# Patient Record
Sex: Female | Born: 1980 | Race: White | Hispanic: No | Smoking: Never smoker
Health system: Southern US, Community
[De-identification: ages and names within clinical notes are randomized; demographics above are authoritative.]

## PROBLEM LIST (undated history)

## (undated) DIAGNOSIS — M722 Plantar fascial fibromatosis: Secondary | ICD-10-CM

## (undated) HISTORY — DX: Plantar fascial fibromatosis: M72.2

---

## 2000-05-29 ENCOUNTER — Other Ambulatory Visit: Admission: RE | Admit: 2000-05-29 | Discharge: 2000-05-29 | Payer: Self-pay | Admitting: Obstetrics and Gynecology

## 2001-12-11 ENCOUNTER — Other Ambulatory Visit: Admission: RE | Admit: 2001-12-11 | Discharge: 2001-12-11 | Payer: Self-pay | Admitting: Obstetrics and Gynecology

## 2003-01-27 ENCOUNTER — Other Ambulatory Visit: Admission: RE | Admit: 2003-01-27 | Discharge: 2003-01-27 | Payer: Self-pay | Admitting: Obstetrics and Gynecology

## 2004-02-18 ENCOUNTER — Other Ambulatory Visit: Admission: RE | Admit: 2004-02-18 | Discharge: 2004-02-18 | Payer: Self-pay | Admitting: Obstetrics and Gynecology

## 2005-03-14 ENCOUNTER — Other Ambulatory Visit: Admission: RE | Admit: 2005-03-14 | Discharge: 2005-03-14 | Payer: Self-pay | Admitting: Obstetrics and Gynecology

## 2009-09-27 ENCOUNTER — Inpatient Hospital Stay (HOSPITAL_COMMUNITY): Admission: AD | Admit: 2009-09-27 | Discharge: 2009-09-27 | Payer: Self-pay | Admitting: Obstetrics and Gynecology

## 2009-09-30 ENCOUNTER — Inpatient Hospital Stay (HOSPITAL_COMMUNITY): Admission: AD | Admit: 2009-09-30 | Discharge: 2009-10-06 | Payer: Self-pay | Admitting: Obstetrics and Gynecology

## 2009-10-03 ENCOUNTER — Encounter (INDEPENDENT_AMBULATORY_CARE_PROVIDER_SITE_OTHER): Payer: Self-pay | Admitting: Obstetrics and Gynecology

## 2010-10-10 LAB — CBC
HCT: 23.5 % — ABNORMAL LOW (ref 36.0–46.0)
HCT: 33.2 % — ABNORMAL LOW (ref 36.0–46.0)
Hemoglobin: 10.5 g/dL — ABNORMAL LOW (ref 12.0–15.0)
Hemoglobin: 11.1 g/dL — ABNORMAL LOW (ref 12.0–15.0)
Hemoglobin: 7.9 g/dL — ABNORMAL LOW (ref 12.0–15.0)
MCHC: 32.9 g/dL (ref 30.0–36.0)
MCHC: 33.4 g/dL (ref 30.0–36.0)
MCHC: 33.4 g/dL (ref 30.0–36.0)
MCHC: 33.8 g/dL (ref 30.0–36.0)
MCV: 84.8 fL (ref 78.0–100.0)
MCV: 84.8 fL (ref 78.0–100.0)
MCV: 85.5 fL (ref 78.0–100.0)
Platelets: 174 10*3/uL (ref 150–400)
Platelets: 186 10*3/uL (ref 150–400)
Platelets: 217 10*3/uL (ref 150–400)
Platelets: 226 10*3/uL (ref 150–400)
RBC: 2.59 MIL/uL — ABNORMAL LOW (ref 3.87–5.11)
RBC: 3.72 MIL/uL — ABNORMAL LOW (ref 3.87–5.11)
RBC: 3.83 MIL/uL — ABNORMAL LOW (ref 3.87–5.11)
RDW: 14.8 % (ref 11.5–15.5)
RDW: 15.1 % (ref 11.5–15.5)
RDW: 15.9 % — ABNORMAL HIGH (ref 11.5–15.5)
WBC: 12.5 10*3/uL — ABNORMAL HIGH (ref 4.0–10.5)
WBC: 9.9 10*3/uL (ref 4.0–10.5)

## 2010-10-10 LAB — COMPREHENSIVE METABOLIC PANEL
ALT: 10 U/L (ref 0–35)
ALT: 14 U/L (ref 0–35)
AST: 17 U/L (ref 0–37)
AST: 18 U/L (ref 0–37)
Albumin: 1.8 g/dL — ABNORMAL LOW (ref 3.5–5.2)
Albumin: 2.3 g/dL — ABNORMAL LOW (ref 3.5–5.2)
CO2: 20 mEq/L (ref 19–32)
Calcium: 7.6 mg/dL — ABNORMAL LOW (ref 8.4–10.5)
Calcium: 8.6 mg/dL (ref 8.4–10.5)
Creatinine, Ser: 0.71 mg/dL (ref 0.4–1.2)
Creatinine, Ser: 0.94 mg/dL (ref 0.4–1.2)
GFR calc Af Amer: >60 mL/min (ref >60)
GFR calc Af Amer: >60 mL/min (ref >60)
Sodium: 134 mEq/L — ABNORMAL LOW (ref 135–145)
Sodium: 135 mEq/L (ref 135–145)
Total Protein: 5.3 g/dL — ABNORMAL LOW (ref 6.0–8.3)

## 2011-08-28 ENCOUNTER — Ambulatory Visit (INDEPENDENT_AMBULATORY_CARE_PROVIDER_SITE_OTHER): Payer: 59 | Admitting: Physician Assistant

## 2011-08-28 VITALS — BP 116/90 | HR 80 | Temp 98.7°F | Resp 16 | Ht 65.0 in | Wt 168.0 lb

## 2011-08-28 DIAGNOSIS — Z23 Encounter for immunization: Secondary | ICD-10-CM

## 2011-08-28 NOTE — Progress Notes (Signed)
  Subjective:    Patient ID: Veronica Parks, female    DOB: 12-12-1980, 31 y.o.   MRN: [id number]  HPI 31 y/o WF presents for Tdap vaccination. 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  Was told she could not return until she got vaccination.  No underlying medical issues.  No other concerns, no need for other vaccinations.   Review of Systems Feeling well.  In good health.     Objective:   Physical Exam Well appearing female.  Not in distress, not acutely ill.        Assessment & Plan:  Tdap vaccination given today.  No other issues.  Side effects of vaccination discussed.  Note for school written.

## 2011-08-28 NOTE — Patient Instructions (Signed)
Tetanus, Diphtheria (Td) or Tetanus, Diphtheria, Pertussis (Tdap) Vaccine What You Need to Know WHY GET VACCINATED? Tetanus, diphtheria and pertussis can be very serious diseases. TETANUS (Lockjaw) causes painful muscle spasms and stiffness, usually all over the body.  Tetanus can lead to tightening of muscles in the head and neck so the victim cannot open his mouth or swallow, or sometimes even breathe. Tetanus kills about 1 out of 5 people who are infected.  DIPHTHERIA can cause a thick membrane to cover the back of the throat.  Diphtheria can lead to breathing problems, paralysis, heart failure, and even death.  PERTUSSIS (Whooping Cough) causes severe coughing spells which can lead to difficulty breathing, vomiting, and disturbed sleep.  Pertussis can lead to weight loss, incontinence, rib fractures and passing out from violent coughing. Up to 2 in 100 adolescents and 5 in 100 adults with pertussis are hospitalized or have complications, including pneumonia and death.  These 3 diseases are all caused by bacteria. Diphtheria and pertussis are spread from person to person. Tetanus enters the body through cuts, scratches, or wounds. The [place] saw as many as 200,000 cases a year of diphtheria and pertussis before vaccines were available, and hundreds of cases of tetanus. Since then, tetanus and diphtheria cases have dropped by about 99% and pertussis cases by about 92%. Children 44 years of age and younger get DTaP vaccine to protect them from these three diseases. But older children, adolescents, and adults need protection too. VACCINES FOR ADOLESCENTS AND ADULTS: TD AND TDAP Two vaccines are available to protect people 51 years of age and older from these diseases:  Td vaccine has been used for many years. It protects against tetanus and diphtheria.   Tdap vaccine was licensed in 2005. It is the first vaccine for adolescents and adults that protects against pertussis as well as tetanus  and diphtheria.  A Td booster dose is recommended every 10 years. Tdap is given only once. WHICH VACCINE, AND WHEN? Ages 7 through 18 years  A dose of Tdap is recommended at age 32 or 73. This dose could be given as early as age 58 for children who missed one or more childhood doses of DTaP.   Children and adolescents who did not get a complete series of DTaP shots by age 22 should complete the series using a combination of Td and Tdap.  Age 27 years and Older  All adults should get a booster dose of Td every 10 years. Adults under 65 who have never gotten Tdap should get a dose of Tdap as their next booster dose. Adults 65 and older may get one booster dose of Tdap.   Adults (including women who may become pregnant and adults 36 and older) who expect to have close contact with a baby younger than 94 months of age should get a dose of Tdap to help protect the baby from pertussis.   Healthcare professionals who have direct patient contact in hospitals or clinics should get one dose of Tdap.  Protection After a Wound  A person who gets a severe cut or burn might need a dose of Td or Tdap to prevent tetanus infection. Tdap should be used for anyone who has never had a dose previously. Td should be used if Tdap is not available, or for:   Anybody who has already had a dose of Tdap.   Children 7 through 87 years of age who completed the childhood DTaP series.   Adults 65 and older.  Pregnant Women  Pregnant women who have never had a dose of Tdap should get one, after the 20th week of gestation and preferably during the 3rd trimester. If they do not get Tdap during their pregnancy they should get a dose as soon as possible after delivery. Pregnant women who have previously received Tdap and need tetanus or diphtheria vaccine while pregnant should get Td.  Tdap and Td may be given at the same time as other vaccines. SOME PEOPLE SHOULD NOT BE VACCINATED OR SHOULD WAIT  Anyone who has had a  life-threatening allergic reaction after a dose of any tetanus, diphtheria, or pertussis containing vaccine should not get Td or Tdap.   Anyone who has a severe allergy to any component of a vaccine should not get that vaccine. Tell your doctor if the person getting the vaccine has any severe allergies.   Anyone who had a coma, or long or multiple seizures within 7 days after a dose of DTP or DTaP should not get Tdap, unless a cause other than the vaccine was found. These people may get Td.   Talk to your doctor if the person getting either vaccine:   Has epilepsy or another nervous system problem.   Had severe swelling or severe pain after a previous dose of DTP, DTaP, DT, Td, or Tdap vaccine.   Has had Guillain Barr Syndrome (GBS).  Anyone who has a moderate or severe illness on the day the shot is scheduled should usually wait until they recover before getting Tdap or Td vaccine. A person with a mild illness or low fever can usually be vaccinated. WHAT ARE THE RISKS FROM TDAP AND TD VACCINES? With a vaccine, as with any medicine, there is always a small risk of a life-threatening allergic reaction or other serious problem. Brief fainting spells and related symptoms (such as jerking movements) can happen after any medical procedure, including vaccination. Sitting or lying down for about 15 minutes after a vaccination can help prevent fainting and injuries caused by falls. Tell your doctor if the patient feels dizzy or lightheaded, or has vision changes or ringing in the ears. Getting tetanus, diphtheria, or pertussis would be much more likely to lead to severe problems than getting either Td or Tdap vaccine. Problems reported after Td and Tdap vaccines are listed below. Mild Problems (noticeable, but did not interfere with activities) Tdap  Pain (about 3 in 4 adolescents and 2 in 3 adults).   Redness or swelling (about 1 in 5).   Mild fever of at least 100.4 F (38 C) (up to about 1 in  25 adolescents and 1 in 100 adults).   Headache (about 4 in 10 adolescents and 3 in 10 adults).   Tiredness (about 1 in 3 adolescents and 1 in 4 adults).   Nausea, vomiting, diarrhea, or stomach ache (up to 1 in 4 adolescents and 1 in 10 adults).   Chills, body aches, sore joints, rash, or swollen glands (uncommon).  Td  Pain (up to about 8 in 10).   Redness or swelling at the injection site (up to about 1 in 3).   Mild fever (up to about 1 in 15).   Headache or tiredness (uncommon).  Moderate Problems (interfered with activities, but did not require medical attention) Tdap  Pain at the injection site (about 1 in 20 adolescents and 1 in 100 adults).   Redness or swelling at the injection site (up to about 1 in 16 adolescents and 1 in 25  adults).   Fever over 102 F (38.9 C) (about 1 in 100 adolescents and 1 in 250 adults).   Headache (1 in 300).   Nausea, vomiting, diarrhea, or stomach ache (up to 3 in 100 adolescents and 1 in 100 adults).  Td  Fever over 102 F (38.9 C) (rare).  Tdap or Td  Extensive swelling of the arm where the shot was given (up to about 3 in 100).  Severe Problems (Unable to perform usual activities; required medical attention) Tdap or Td  Swelling, severe pain, bleeding, and redness in the arm where the shot was given (rare).  A severe allergic reaction could occur after any vaccine. They are estimated to occur less than once in a million doses. WHAT IF THERE IS A SEVERE REACTION? What should I look for? Any unusual condition, such as a severe allergic reaction or a high fever. If a severe allergic reaction occurred, it would be within a few minutes to an hour after the shot. Signs of a serious allergic reaction can include difficulty breathing, weakness, hoarseness or wheezing, a fast heartbeat, hives, dizziness, paleness, or swelling of the throat. What should I do?  Call a doctor, or get the person to a doctor right away.   Tell your doctor  what happened, the date and time it happened, and when the vaccination was given.   Ask your provider to report the reaction by filing a Vaccine Adverse Event Reporting System (VAERS) form. Or, you can file this report through the VAERS website at www.vaers.LAgents.no or by calling 1-737-459-5818.  VAERS does not provide medical advice. THE NATIONAL VACCINE INJURY COMPENSATION PROGRAM The National Vaccine Injury Compensation Program (VICP) was created in 1986. Persons who believe they may have been injured by a vaccine can learn about the program and about filing a claim by calling 1-6052107427 or visiting the VICP website at SpiritualWord.at. HOW CAN I LEARN MORE?  Your doctor can give you the vaccine package insert or suggest other sources of information.   Call your local or state health department.   Contact the Centers for Disease Control and Prevention (CDC):   Call 443 876 7967 (1-800-CDC-INFO).   Visit the [facility] website at PicCapture.uy.  CDC Td and Tdap Interim VIS (1/12) Document Released: 04/30/2006 Document Revised: 03/21/2011 Document Reviewed: 08/09/2010 [facility] Patient Information 2012 [place], [place].

## 2013-07-29 ENCOUNTER — Telehealth: Payer: Self-pay | Admitting: Family Medicine

## 2013-07-29 MED ORDER — OSELTAMIVIR PHOSPHATE 75 MG PO CAPS: 75.0000 mg | ORAL_CAPSULE | Freq: Two times a day (BID) | ORAL | Status: DC

## 2013-07-29 NOTE — Telephone Encounter (Signed)
I agree. Approved.  Patient's husband seen by me in office today with positive influenza test.  Patient reports that she was in normal state of health through yesterday but today has developed cough and fever. Therefore will call in Tamiflu to treat for influenza. She has been informed to use Tylenol and Motrin as needed for fever and myalgias. As well she is aware to followup if symptoms worsen or do not improve and resolve over the upcoming week.

## 2013-07-29 NOTE — Telephone Encounter (Signed)
Husband and son with flu. Wants RX called in for her, starting with cough.  Per provider Tamiflu 75 mg bid x 5 days to pharmacy

## 2013-07-29 NOTE — Telephone Encounter (Signed)
Tamiflu sent to correct pharmacy

## 2014-07-06 ENCOUNTER — Ambulatory Visit (INDEPENDENT_AMBULATORY_CARE_PROVIDER_SITE_OTHER): Payer: BC Managed Care – PPO

## 2014-07-06 ENCOUNTER — Ambulatory Visit: Payer: BC Managed Care – PPO | Admitting: Emergency Medicine

## 2014-07-06 ENCOUNTER — Ambulatory Visit (INDEPENDENT_AMBULATORY_CARE_PROVIDER_SITE_OTHER): Payer: BC Managed Care – PPO | Admitting: Emergency Medicine

## 2014-07-06 VITALS — BP 130/78 | HR 91 | Temp 98.3°F | Resp 20 | Ht 65.0 in | Wt 210.0 lb

## 2014-07-06 DIAGNOSIS — S8001XA Contusion of right knee, initial encounter: Secondary | ICD-10-CM

## 2014-07-06 DIAGNOSIS — J0140 Acute pansinusitis, unspecified: Secondary | ICD-10-CM

## 2014-07-06 DIAGNOSIS — J209 Acute bronchitis, unspecified: Secondary | ICD-10-CM

## 2014-07-06 DIAGNOSIS — Z3202 Encounter for pregnancy test, result negative: Secondary | ICD-10-CM

## 2014-07-06 LAB — POCT URINE PREGNANCY: PREG TEST UR: NEGATIVE

## 2014-07-06 MED ORDER — PSEUDOEPHEDRINE-GUAIFENESIN ER 60-600 MG PO TB12
1.0000 | ORAL_TABLET | Freq: Two times a day (BID) | ORAL | Status: AC
Start: 2014-07-06 — End: 2015-07-06

## 2014-07-06 MED ORDER — HYDROCOD POLST-CHLORPHEN POLST 10-8 MG/5ML PO LQCR: 5.0000 mL | Freq: Two times a day (BID) | ORAL | Status: DC | PRN

## 2014-07-06 MED ORDER — AMOXICILLIN-POT CLAVULANATE 875-125 MG PO TABS: 1.0000 | ORAL_TABLET | Freq: Two times a day (BID) | ORAL | Status: DC

## 2014-07-06 MED ORDER — NAPROXEN SODIUM 550 MG PO TABS: 550.0000 mg | ORAL_TABLET | Freq: Two times a day (BID) | ORAL | Status: AC

## 2014-07-06 NOTE — Progress Notes (Signed)
[facility] [address], [place] [address] [phone]  Date:  07/06/2014   Name:  Veronica ArchMiranda Parks   DOB:  [birth year]   MRN:  [id number]  PCP:  PROVIDER NOT IN SYSTEM    Chief Complaint: Cough and Nasal Congestion   History of Present Illness:  Veronica ArchMiranda Parks is a 33 y.o. very pleasant female patient who presents with the following:  Ill for a week with nasal congestion and purulent nasal drainage and post nasal drip since last week Has no sore throat No fever or chills Has a cough productive of purulent sputum No wheezing or shortness of breath No nausea or vomiting.  No stool change No rash No improvement with over the counter medications or other home remedies.  Denies other complaint or health concern today.  There are no active problems to display for this patient.   History reviewed. No pertinent past medical history.  Past Surgical History  Procedure Laterality Date  . Cesarean section      History  Substance Use Topics  . Smoking status: Never Smoker   . Smokeless tobacco: Never Used  . Alcohol Use: 0.0 oz/week    0 Not specified per week    Family History  Problem Relation Age of Onset  . Hypertension Mother   . Cancer Maternal Grandfather     No Known Allergies  Medication list has been reviewed and updated.  No current outpatient prescriptions on file prior to visit.   No current facility-administered medications on file prior to visit.    Review of Systems:  As per HPI, otherwise negative.    Physical Examination: Filed Vitals:   07/06/14 1923  BP: 130/78  Pulse: 91  Temp: 98.3 F (36.8 C)  Resp: 20   Filed Vitals:   07/06/14 1923  Height: 5\' 5"  (1.651 m)  Weight: 210 lb (95.255 kg)   Body mass index is 34.95 kg/(m^2). Ideal Body Weight: Weight in (lb) to have BMI = 25: 149.9  GEN: WDWN, NAD, Non-toxic, A & O x 3 HEENT: Atraumatic, Normocephalic. Neck supple. No masses, No LAD. Ears and Nose: No  external deformity. CV: RRR, No M/G/R. No JVD. No thrill. No extra heart sounds. PULM: CTA B, no wheezes, crackles, rhonchi. No retractions. No resp. distress. No accessory muscle use. ABD: S, NT, ND, +BS. No rebound. No HSM. EXTR: No c/c/e NEURO Normal gait.  PSYCH: Normally interactive. Conversant. Not depressed or anxious appearing.  Calm demeanor.    Assessment and Plan: Sinusitis and bronchitis augmentin mucinex d tussionex   Signed,  Phillips OdorJeffery Anderson, MD

## 2014-07-06 NOTE — Patient Instructions (Signed)
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
## 2014-07-06 NOTE — Patient Instructions (Signed)

## 2014-07-13 ENCOUNTER — Ambulatory Visit (INDEPENDENT_AMBULATORY_CARE_PROVIDER_SITE_OTHER): Payer: BC Managed Care – PPO | Admitting: Family Medicine

## 2014-07-13 ENCOUNTER — Encounter: Payer: Self-pay | Admitting: Family Medicine

## 2014-07-13 VITALS — BP 118/94 | HR 108 | Temp 97.7°F | Resp 16 | Ht 65.5 in | Wt 205.6 lb

## 2014-07-13 DIAGNOSIS — J209 Acute bronchitis, unspecified: Secondary | ICD-10-CM

## 2014-07-13 DIAGNOSIS — J0100 Acute maxillary sinusitis, unspecified: Secondary | ICD-10-CM

## 2014-07-13 DIAGNOSIS — J9801 Acute bronchospasm: Secondary | ICD-10-CM

## 2014-07-13 MED ORDER — PREDNISONE 20 MG PO TABS: ORAL_TABLET | ORAL | Status: DC

## 2014-07-13 MED ORDER — ALBUTEROL SULFATE HFA 108 (90 BASE) MCG/ACT IN AERS: 2.0000 | INHALATION_SPRAY | RESPIRATORY_TRACT | Status: DC | PRN

## 2014-07-13 MED ORDER — BENZONATATE 100 MG PO CAPS: 100.0000 mg | ORAL_CAPSULE | Freq: Three times a day (TID) | ORAL | Status: DC | PRN

## 2014-07-13 MED ORDER — HYDROCOD POLST-CHLORPHEN POLST 10-8 MG/5ML PO LQCR: 5.0000 mL | Freq: Two times a day (BID) | ORAL | Status: DC | PRN

## 2014-07-13 NOTE — Progress Notes (Signed)
Subjective:    Patient ID: Veronica Parks, female    DOB: 19-Jan-1981, 33 y.o.   MRN: [id number]  HPI Patient returns to clinic for worsening productive cough. Evaluated one week ago by Dr. Dareen PianoAnderson; treated with Augmentin, Tussionex, Mucinex D.  Pt now feels worse than last week; energy level has worsened because not sleeping due to cough at nighttime.   Cough is keeping her up at night and causing back pain and headaches. Sputum now is green and does not feel like is getting it all out. Feels more tired and was unable to finish work today. Has nasal and chest congestion with rhinorrhea and chest pain secondary to cough. Has also had single episode of nausea and diarrhea today.  Denies fever/chills/sweats.  +HA.  No ear pain or sore throat. No rhinorrhea; +nasal congestion.  Scant PND.  +horrible cough.  +wheezing. No SOB.    No h/o asthma or allergies and is not a smoker. 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   Works as an Airline pilotaccountant.  Review of Systems  Constitutional: Positive for fatigue. Negative for fever (but felt hot), chills and diaphoresis.  HENT: Positive for congestion, rhinorrhea and voice change. Negative for ear pain, postnasal drip, sinus pressure, sneezing, sore throat and trouble swallowing.   Respiratory: Positive for cough. Negative for shortness of breath and wheezing.   Cardiovascular: Positive for chest pain (secondary to cough). Negative for palpitations and leg swelling.  Gastrointestinal: Positive for nausea (isolated episode) and diarrhea (this afternoon). Negative for vomiting and abdominal pain.  Musculoskeletal: Positive for back pain.  Skin: Negative for rash.  Allergic/Immunologic: Negative for environmental allergies and food allergies.  Neurological: Positive for headaches. Negative for dizziness and light-headedness.       Objective:   Physical Exam  Constitutional: She is oriented to person, place,  and time. She appears well-developed and well-nourished. No distress.  Blood pressure 118/94, pulse 108, temperature 97.7 F (36.5 C), temperature source Oral, resp. rate 16, height 5' 5.5" (1.664 m), weight 205 lb 9.6 oz (93.26 kg), last menstrual period 06/19/2014, SpO2 98 %.  HENT:  Head: Normocephalic and atraumatic.  Right Ear: External ear normal.  Left Ear: External ear normal.  Nose: Nose normal.  Mouth/Throat: Oropharynx is clear and moist. No oropharyngeal exudate.  Eyes: Conjunctivae and EOM are normal. Pupils are equal, round, and reactive to light. Right eye exhibits no discharge. Left eye exhibits no discharge. No scleral icterus.  Neck: Neck supple. No thyromegaly present.  Cardiovascular: Regular rhythm and normal heart sounds.  Tachycardia present.  Exam reveals no gallop and no friction rub.   No murmur heard. Pulmonary/Chest: Effort normal. No tachypnea and no bradypnea. No respiratory distress. She has no decreased breath sounds. She has wheezes (fine wheezes worsened by forced expiration) in the right middle field, the right lower field, the left middle field and the left lower field. She has no rhonchi. She has no rales. She exhibits no tenderness.  Abdominal: Soft. Bowel sounds are normal. There is no tenderness.  Musculoskeletal:  Mid back pain reproducible with palpation.  Lymphadenopathy:    She has no cervical adenopathy.  Neurological: She is alert and oriented to person, place, and time.  Skin: Skin is warm and dry. No rash noted. She is not diaphoretic. No erythema. No pallor.        Assessment & Plan:  1. Acute maxillary sinusitis, recurrence not specified -Persistent; complete Augmentin  as prescribed.   -Add Prednisone. -Continue Mucinex D.  2. Bronchospasm -New.  Etiology to worsening cough.   -Rx for Albuterol HFA provided to use qid scheduled for next week and then PRN. -Rx for Prednisone taper provided. -Refill of Tussionex provided.  3. Acute  bronchitis, unspecified organism -New.  - albuterol (PROVENTIL HFA;VENTOLIN HFA) 108 (90 BASE) MCG/ACT inhaler; Inhale 2 puffs into the lungs every 4 (four) hours as needed for wheezing or shortness of breath (cough, shortness of breath or wheezing.).  Dispense: 1 Inhaler; Refill: 1 - benzonatate (TESSALON) 100 MG capsule; Take 1-2 capsules (100-200 mg total) by mouth 3 (three) times daily as needed for cough.  Dispense: 40 capsule; Refill: 0   Meds ordered this encounter  Medications  . albuterol (PROVENTIL HFA;VENTOLIN HFA) 108 (90 BASE) MCG/ACT inhaler    Sig: Inhale 2 puffs into the lungs every 4 (four) hours as needed for wheezing or shortness of breath (cough, shortness of breath or wheezing.).    Dispense:  1 Inhaler    Refill:  1    Order Specific Question:  Supervising Provider    Answer:  Ethelda ChickSMITH, Alarik Radu M [[id number]]  . benzonatate (TESSALON) 100 MG capsule    Sig: Take 1-2 capsules (100-200 mg total) by mouth 3 (three) times daily as needed for cough.    Dispense:  40 capsule    Refill:  0    Order Specific Question:  Supervising Provider    Answer:  Ethelda ChickSMITH, Celina Shiley M [[id number]]  . chlorpheniramine-HYDROcodone (TUSSIONEX PENNKINETIC ER) 10-8 MG/5ML LQCR    Sig: Take 5 mLs by mouth every 12 (twelve) hours as needed.    Dispense:  120 mL    Refill:  0  . predniSONE (DELTASONE) 20 MG tablet    Sig: Three tablets daily x 2 days then two tablets daily x 5 days then one tablet daily x 3 days    Dispense:  19 tablet    Refill:  0    Cecilie LowersKristi Martin Little [place], M.D. [facility] 07/14/2014 9:51 AM

## 2014-08-03 ENCOUNTER — Ambulatory Visit (INDEPENDENT_AMBULATORY_CARE_PROVIDER_SITE_OTHER): Payer: BLUE CROSS/BLUE SHIELD | Admitting: Family Medicine

## 2014-08-03 ENCOUNTER — Encounter: Payer: Self-pay | Admitting: Family Medicine

## 2014-08-03 VITALS — BP 110/74 | HR 100 | Temp 98.3°F | Resp 20 | Wt 205.0 lb

## 2014-08-03 DIAGNOSIS — J111 Influenza due to unidentified influenza virus with other respiratory manifestations: Secondary | ICD-10-CM

## 2014-08-03 DIAGNOSIS — J029 Acute pharyngitis, unspecified: Secondary | ICD-10-CM

## 2014-08-03 DIAGNOSIS — R509 Fever, unspecified: Secondary | ICD-10-CM

## 2014-08-03 LAB — INFLUENZA A AND B
Inflenza A Ag: NEGATIVE
Influenza B Ag: NEGATIVE

## 2014-08-03 LAB — RAPID STREP SCREEN (MED CTR MEBANE ONLY): Streptococcus, Group A Screen (Direct): NEGATIVE

## 2014-08-03 NOTE — Progress Notes (Signed)
Subjective:    Patient ID: Veronica Parks, female    DOB: 08/30/1980, 34 y.o.   MRN: [id number]  HPI Patient is a very pleasant 34 year old white female who developed a fever to 101 yesterday along with diffuse myalgias, sore throat, and head congestion. Husband has recently been diagnosed with a flulike syndrome. She denies any ear pain. She denies any sinus pain. She denies any productive cough. No past medical history on file. Past Surgical History  Procedure Laterality Date  . Cesarean section     Current Outpatient Prescriptions on File Prior to Visit  Medication Sig Dispense Refill  . albuterol (PROVENTIL HFA;VENTOLIN HFA) 108 (90 BASE) MCG/ACT inhaler Inhale 2 puffs into the lungs every 4 (four) hours as needed for wheezing or shortness of breath (cough, shortness of breath or wheezing.). 1 Inhaler 1  . amoxicillin-clavulanate (AUGMENTIN) 875-125 MG per tablet Take 1 tablet by mouth 2 (two) times daily. (Patient not taking: Reported on 08/03/2014) 20 tablet 0  . benzonatate (TESSALON) 100 MG capsule Take 1-2 capsules (100-200 mg total) by mouth 3 (three) times daily as needed for cough. (Patient not taking: Reported on 08/03/2014) 40 capsule 0  . chlorpheniramine-HYDROcodone (TUSSIONEX PENNKINETIC ER) 10-8 MG/5ML LQCR Take 5 mLs by mouth every 12 (twelve) hours as needed. (Patient not taking: Reported on 08/03/2014) 120 mL 0  . naproxen sodium (ANAPROX DS) 550 MG tablet Take 1 tablet (550 mg total) by mouth 2 (two) times daily with a meal. (Patient not taking: Reported on 08/03/2014) 40 tablet 0  . predniSONE (DELTASONE) 20 MG tablet Three tablets daily x 2 days then two tablets daily x 5 days then one tablet daily x 3 days (Patient not taking: Reported on 08/03/2014) 19 tablet 0  . pseudoephedrine-guaifenesin (MUCINEX D) 60-600 MG per tablet Take 1 tablet by mouth every 12 (twelve) hours. (Patient not taking: Reported on 08/03/2014) 18 tablet 0   No current facility-administered medications  on file prior to visit.   No Known Allergies History   Social History  . Marital Status: Married    Spouse Name: N/A    Number of Children: N/A  . Years of Education: N/A   Occupational History  . Not on file.   Social History Main Topics  . Smoking status: Never Smoker   . Smokeless tobacco: Never Used  . Alcohol Use: 0.0 oz/week    0 Not specified per week  . Drug Use: No  . Sexual Activity: Not on file   Other Topics Concern  . Not on file   Social History Narrative      Review of Systems  All other systems reviewed and are negative.      Objective:   Physical Exam  Constitutional: She appears well-developed and well-nourished. No distress.  HENT:  Right Ear: External ear normal.  Left Ear: External ear normal.  Nose: Mucosal edema and rhinorrhea present. Right sinus exhibits no maxillary sinus tenderness and no frontal sinus tenderness. Left sinus exhibits no maxillary sinus tenderness and no frontal sinus tenderness.  Mouth/Throat: Oropharynx is clear and moist. No oropharyngeal exudate.  Eyes: Conjunctivae are normal. Pupils are equal, round, and reactive to light.  Neck: Neck supple.  Cardiovascular: Normal rate, regular rhythm and normal heart sounds.   No murmur heard. Pulmonary/Chest: Effort normal and breath sounds normal. No respiratory distress. She has no wheezes. She has no rales.  Lymphadenopathy:    She has no cervical adenopathy.  Skin: She is not diaphoretic.  Vitals reviewed.  Assessment & Plan:  Fever chills - Plan: Rapid strep screen, Influenza a and b  Sore throat - Plan: Rapid strep screen, Influenza a and b  Flu syndrome  I believe the patient has a viral upper respiratory infection. I recommended supportive care including Sudafed for congestion, Tylenol and Advil for aches and fever, Chloraseptic for sore throat. I recommended tincture of time. I anticipate 7-10 days before complete resolution of symptoms.

## 2016-03-15 ENCOUNTER — Ambulatory Visit (INDEPENDENT_AMBULATORY_CARE_PROVIDER_SITE_OTHER): Payer: BLUE CROSS/BLUE SHIELD | Admitting: Physician Assistant

## 2016-03-15 ENCOUNTER — Encounter: Payer: Self-pay | Admitting: Physician Assistant

## 2016-03-15 VITALS — BP 140/90 | HR 87 | Temp 98.7°F | Resp 18 | Wt 250.0 lb

## 2016-03-15 DIAGNOSIS — R609 Edema, unspecified: Secondary | ICD-10-CM

## 2016-03-15 DIAGNOSIS — I10 Essential (primary) hypertension: Secondary | ICD-10-CM | POA: Diagnosis not present

## 2016-03-15 DIAGNOSIS — K219 Gastro-esophageal reflux disease without esophagitis: Secondary | ICD-10-CM | POA: Diagnosis not present

## 2016-03-15 DIAGNOSIS — R5383 Other fatigue: Secondary | ICD-10-CM

## 2016-03-15 LAB — CBC WITH DIFFERENTIAL/PLATELET
BASOS PCT: 0 %
Basophils Absolute: 0 cells/uL (ref 0–200)
EOS PCT: 1 %
Eosinophils Absolute: 79 cells/uL (ref 15–500)
HCT: 42.2 % (ref 35.0–45.0)
Hemoglobin: 14 g/dL (ref 12.0–15.0)
LYMPHS PCT: 33 %
Lymphs Abs: 2607 cells/uL (ref 850–3900)
MCH: 30.3 pg (ref 27.0–33.0)
MCHC: 33.2 g/dL (ref 32.0–36.0)
MCV: 91.3 fL (ref 80.0–100.0)
MONOS PCT: 9 %
MPV: 9.3 fL (ref 7.5–12.5)
Monocytes Absolute: 711 cells/uL (ref 200–950)
NEUTROS ABS: 4503 {cells}/uL (ref 1500–7800)
Neutrophils Relative %: 57 %
PLATELETS: 317 10*3/uL (ref 140–400)
RBC: 4.62 MIL/uL (ref 3.80–5.10)
RDW: 13.9 % (ref 11.0–15.0)
WBC: 7.9 10*3/uL (ref 3.8–10.8)

## 2016-03-15 LAB — COMPLETE METABOLIC PANEL WITH GFR
ALT: 14 U/L (ref 6–29)
AST: 16 U/L (ref 10–30)
Albumin: 3.9 g/dL (ref 3.6–5.1)
Alkaline Phosphatase: 58 U/L (ref 33–115)
BUN: 9 mg/dL (ref 7–25)
CHLORIDE: 103 mmol/L (ref 98–110)
CO2: 24 mmol/L (ref 20–31)
Calcium: 8.9 mg/dL (ref 8.6–10.2)
Creat: 0.96 mg/dL (ref 0.50–1.10)
GFR, EST NON AFRICAN AMERICAN: 77 mL/min (ref >=60)
GFR, Est African American: 89 mL/min (ref >=60)
GLUCOSE: 81 mg/dL (ref 70–99)
POTASSIUM: 4 mmol/L (ref 3.5–5.3)
SODIUM: 137 mmol/L (ref 135–146)
Total Bilirubin: 0.6 mg/dL (ref 0.2–1.2)
Total Protein: 6.6 g/dL (ref 6.1–8.1)

## 2016-03-15 LAB — TSH: TSH: 2.59 mIU/L

## 2016-03-15 MED ORDER — LISINOPRIL 10 MG PO TABS: 10.0000 mg | ORAL_TABLET | Freq: Every day | ORAL | 0 refills | Status: DC

## 2016-03-15 MED ORDER — OMEPRAZOLE 20 MG PO CPDR: 20.0000 mg | DELAYED_RELEASE_CAPSULE | Freq: Every day | ORAL | 3 refills | Status: DC

## 2016-03-15 NOTE — Progress Notes (Signed)
Patient ID: Veronica Parks MRN: [id number], DOB: [birth year], 35 y.o. Date of Encounter: @DATE @  Chief Complaint:  Chief Complaint  Patient presents with  . Chills    cold chills,`  . Edema     both feet and ankle swelling  . Fatigue    HPI: 35 y.o. year old female  presents with above.   Says that a couple weeks ago her son was sick with a fever. A few days after that-- she was feeling some chills-- so she started to check her temperature in case she was getting his illness. However, all of her temperature readings were low.  Says that [place] was in [place] a [place] conference a couple of weeks ago and she had more swelling in her feet than usual. Says that she was most concerned because usually if she elevates her feet or sleeps overnight the next morning the swelling will be resolved. Says that on one occasion even when she woke up the next morning her feet were still swollen. was unsure if there was something going on that she should be concerned about or not. Has not had significant amount of swelling since then.  Also has been having some heartburn recently. Doesn't happen every day but will sometimes happen 2 or 3 days in a row. Says it is actually a burning discomfort in her chest  Also says that she has been "hearing her pulse" in her left ear for quite a while and doesn't know if this is because of high blood pressure. Has not had her blood pressure checked anywhere else --- other than going for her routine GYN visits.  Also has been more tired than usual lately.  Says that she has gained quite a bit of weight recently and doesn't how many of these symptoms and problems were secondary to that or whether there was some other underlying medical problem going on.  No other complaints or concerns.   No past medical history on file.   Home Meds: Outpatient Medications Prior to Visit  Medication Sig Dispense Refill  . albuterol (PROVENTIL HFA;VENTOLIN HFA) 108 (90 BASE)  MCG/ACT inhaler Inhale 2 puffs into the lungs every 4 (four) hours as needed for wheezing or shortness of breath (cough, shortness of breath or wheezing.). (Patient not taking: Reported on 03/15/2016) 1 Inhaler 1  . amoxicillin-clavulanate (AUGMENTIN) 875-125 MG per tablet Take 1 tablet by mouth 2 (two) times daily. (Patient not taking: Reported on 08/03/2014) 20 tablet 0  . benzonatate (TESSALON) 100 MG capsule Take 1-2 capsules (100-200 mg total) by mouth 3 (three) times daily as needed for cough. (Patient not taking: Reported on 08/03/2014) 40 capsule 0  . chlorpheniramine-HYDROcodone (TUSSIONEX PENNKINETIC ER) 10-8 MG/5ML LQCR Take 5 mLs by mouth every 12 (twelve) hours as needed. (Patient not taking: Reported on 08/03/2014) 120 mL 0  . predniSONE (DELTASONE) 20 MG tablet Three tablets daily x 2 days then two tablets daily x 5 days then one tablet daily x 3 days (Patient not taking: Reported on 08/03/2014) 19 tablet 0   No facility-administered medications prior to visit.     Allergies: No Known Allergies  Social History   Social History  . Marital status: Married    Spouse name: N/A  . Number of children: N/A  . Years of education: N/A   Occupational History  . Not on file.   Social History Main Topics  . Smoking status: Never Smoker  . Smokeless tobacco: Never Used  . Alcohol use  0.0 oz/week  . Drug use: No  . Sexual activity: Not on file   Other Topics Concern  . Not on file   Social History Narrative  . No narrative on file    Family History  Problem Relation Age of Onset  . Hypertension Mother   . Cancer Maternal Grandfather      Review of Systems:  See HPI for pertinent ROS. All other ROS negative.    Physical Exam: Blood pressure 140/90, pulse 87, temperature 98.7 F (37.1 C), temperature source Oral, resp. rate 18, weight 250 lb (113.4 kg), last menstrual period 03/13/2016, SpO2 98 %., Body mass index is 40.97 kg/m. General: WF. Appears in no acute  distress. Head: Normocephalic, atraumatic, eyes without discharge, sclera non-icteric, nares are without discharge. Bilateral auditory canals clear, TM's are without perforation, pearly grey and translucent with reflective cone of light bilaterally.   Neck: Supple. No thyromegaly. No lymphadenopathy. Lungs: Clear bilaterally to auscultation without wheezes, rales, or rhonchi. Breathing is unlabored. Heart: RRR with S1 S2. No murmurs, rubs, or gallops. Musculoskeletal:  Strength and tone normal for age. Extremities/Skin: Warm and dry.  No edema.  Neuro: Alert and oriented X 3. Moves all extremities spontaneously. Gait is normal. CNII-XII grossly in tact. Psych:  Responds to questions appropriately with a normal affect.     ASSESSMENT AND PLAN:  35 y.o. year old female with  1. Other fatigue Check labs for underlying etiology to her fatigue. If labs normal, then fatigue probably secondary to increasing age and weight gain. - CBC with Differential/Platelet - COMPLETE METABOLIC PANEL WITH GFR - TSH  2. Swelling Discussed that swelling most likely secondary to increased sodium intake and heat (summer) - COMPLETE METABOLIC PANEL WITH GFR  3. Gastroesophageal reflux disease, esophagitis presence not specified She can take omeprazole daily / as needed. - omeprazole (PRILOSEC) 20 MG capsule; Take 1 capsule (20 mg total) by mouth daily.  Dispense: 30 capsule; Refill: 3  4. Essential hypertension Discussed that blood pressure is borderline high. Discussed whether she wants to go ahead and start medication versus monitoring this. She states that if it is something that needs treating she wants to go ahead and treat it. She is to take the lisinopril daily. Return for follow-up visit and lab work in 2 weeks to recheck blood pressure and bmet on medication. Low-sodium diet and exercise will help this as well. - lisinopril (PRINIVIL,ZESTRIL) 10 MG tablet; Take 1 tablet (10 mg total) by mouth daily.   Dispense: 30 tablet; Refill: 0  Will f/u with her with lab results.  She will follow-up in 2 weeks for office visit and lab work.  Signed, [address] [place], [place], [facility] 03/15/2016 1:08 PM

## 2016-03-17 ENCOUNTER — Ambulatory Visit: Payer: BLUE CROSS/BLUE SHIELD | Admitting: Family Medicine

## 2016-03-17 ENCOUNTER — Other Ambulatory Visit: Payer: Self-pay | Admitting: Family Medicine

## 2016-03-17 DIAGNOSIS — K219 Gastro-esophageal reflux disease without esophagitis: Secondary | ICD-10-CM

## 2016-03-17 DIAGNOSIS — I10 Essential (primary) hypertension: Secondary | ICD-10-CM

## 2016-03-17 MED ORDER — LISINOPRIL 10 MG PO TABS: 10.0000 mg | ORAL_TABLET | Freq: Every day | ORAL | 0 refills | Status: DC

## 2016-03-17 MED ORDER — OMEPRAZOLE 20 MG PO CPDR: 20.0000 mg | DELAYED_RELEASE_CAPSULE | Freq: Every day | ORAL | 1 refills | Status: DC

## 2016-03-17 NOTE — Telephone Encounter (Signed)
Refills rerouted to new pharmacy

## 2016-03-29 ENCOUNTER — Ambulatory Visit: Payer: BLUE CROSS/BLUE SHIELD | Admitting: Physician Assistant

## 2019-02-20 ENCOUNTER — Other Ambulatory Visit (HOSPITAL_COMMUNITY): Payer: Self-pay | Admitting: Surgery

## 2019-02-20 ENCOUNTER — Other Ambulatory Visit: Payer: Self-pay | Admitting: Surgery

## 2019-02-28 ENCOUNTER — Other Ambulatory Visit: Payer: Self-pay

## 2019-02-28 ENCOUNTER — Ambulatory Visit (HOSPITAL_COMMUNITY)
Admission: RE | Admit: 2019-02-28 | Discharge: 2019-02-28 | Disposition: A | Discharge status: Home / Self Care | Payer: BLUE CROSS/BLUE SHIELD | Source: Ambulatory Visit | Attending: Surgery | Admitting: Surgery

## 2019-05-15 ENCOUNTER — Other Ambulatory Visit: Payer: Self-pay | Admitting: Nurse Practitioner

## 2019-05-15 DIAGNOSIS — R1031 Right lower quadrant pain: Secondary | ICD-10-CM

## 2019-05-16 ENCOUNTER — Other Ambulatory Visit: Payer: Self-pay

## 2019-05-16 ENCOUNTER — Ambulatory Visit (HOSPITAL_BASED_OUTPATIENT_CLINIC_OR_DEPARTMENT_OTHER)
Admission: RE | Admit: 2019-05-16 | Discharge: 2019-05-16 | Disposition: A | Discharge status: Home / Self Care | Payer: 59 | Source: Ambulatory Visit | Attending: Nurse Practitioner | Admitting: Nurse Practitioner

## 2019-05-16 DIAGNOSIS — R1031 Right lower quadrant pain: Secondary | ICD-10-CM

## 2019-05-16 MED ORDER — IOHEXOL 300 MG/ML  SOLN
100.0000 mL | Freq: Once | INTRAMUSCULAR | Status: AC | PRN
  Administered 2019-05-16: 100 mL via INTRAVENOUS

## 2019-06-09 ENCOUNTER — Ambulatory Visit: Payer: 59 | Admitting: Podiatry

## 2019-06-09 ENCOUNTER — Ambulatory Visit (INDEPENDENT_AMBULATORY_CARE_PROVIDER_SITE_OTHER): Payer: 59

## 2019-06-09 ENCOUNTER — Other Ambulatory Visit: Payer: Self-pay

## 2019-06-09 ENCOUNTER — Encounter: Payer: Self-pay | Admitting: Podiatry

## 2019-06-09 ENCOUNTER — Other Ambulatory Visit: Payer: Self-pay | Admitting: Podiatry

## 2019-06-09 VITALS — BP 139/88 | HR 89 | Resp 16

## 2019-06-09 DIAGNOSIS — M722 Plantar fascial fibromatosis: Secondary | ICD-10-CM

## 2019-06-09 DIAGNOSIS — M79671 Pain in right foot: Secondary | ICD-10-CM

## 2019-06-09 MED ORDER — DICLOFENAC SODIUM 75 MG PO TBEC: 75.0000 mg | DELAYED_RELEASE_TABLET | Freq: Two times a day (BID) | ORAL | 2 refills | Status: AC

## 2019-06-09 NOTE — Progress Notes (Signed)
   Subjective:    Patient ID: Veronica Parks, female    DOB: September 25, 1980, 38 y.o.   MRN: [id number]  HPI    Review of Systems  All other systems reviewed and are negative.      Objective:   Physical Exam        Assessment & Plan:

## 2019-06-09 NOTE — Patient Instructions (Signed)

## 2019-06-10 NOTE — Progress Notes (Signed)
Subjective:   Patient ID: Veronica Parks, female   DOB: 38 y.o.   MRN: [id number]   HPI Patient presents stating she is had a lot of pain in both of her heels stating the right is worse.  She used a flat shoe and it seemed to occur after that and patient does not smoke and would like to be more active and has gained weight   Review of Systems  All other systems reviewed and are negative.       Objective:  Physical Exam Vitals signs and nursing note reviewed.  Constitutional:      Appearance: She is well-developed.  Pulmonary:     Effort: Pulmonary effort is normal.  Musculoskeletal: Normal range of motion.  Skin:    General: Skin is warm.  Neurological:     Mental Status: She is alert.     Neurovascular status intact muscle strength adequate range of motion within normal limits with patient noted to have discomfort of a significant nature heel right over left with inflammation fluid around the medial band.  Patient has moderate depression of the arch is noted to have good digital perfusion well oriented x3     Assessment:  Acute plantar fasciitis with long-term nature heel region right over left     Plan:  H&P reviewed condition and x-rays.  Today I did sterile prep and injected the plantar fascia bilateral 3 mg Kenalog 5 mg Xylocaine applied fascial brace bilateral gave instructions for physical therapy support therapy and discussed possible orthotics in future.  Reappoint several weeks and also placed on diclofenac 75 mg twice daily  X-rays indicate moderately high arched foot structure with spur formation bilateral

## 2019-06-23 ENCOUNTER — Ambulatory Visit: Payer: 59 | Admitting: Podiatry

## 2019-06-23 ENCOUNTER — Other Ambulatory Visit: Payer: Self-pay

## 2019-06-23 ENCOUNTER — Encounter: Payer: Self-pay | Admitting: Podiatry

## 2019-06-23 DIAGNOSIS — M722 Plantar fascial fibromatosis: Secondary | ICD-10-CM

## 2019-06-23 NOTE — Progress Notes (Signed)
Subjective:   Patient ID: Veronica Parks, female   DOB: 38 y.o.   MRN: [id number]   HPI Patient presents with inflammation around the plantar heel right that is more distal than it was previously with the left being pretty good   ROS      Objective:  Physical Exam  Inflammatory plantar fasciitis still present right over left     Assessment:  Chronic fasciitis right over left    Plan:  Reviewed condition did sterile prep and injected the right plantar fashion distal to the insertion 3 mg Kenalog 5 mg Xylocaine applied sterile dressing.  Reappoint as needed

## 2019-06-24 ENCOUNTER — Other Ambulatory Visit: Payer: Self-pay

## 2019-06-25 ENCOUNTER — Encounter: Payer: Self-pay | Admitting: Family Medicine

## 2019-06-25 ENCOUNTER — Ambulatory Visit (INDEPENDENT_AMBULATORY_CARE_PROVIDER_SITE_OTHER): Payer: 59 | Admitting: Family Medicine

## 2019-06-25 DIAGNOSIS — Z6841 Body Mass Index (BMI) 40.0 and over, adult: Secondary | ICD-10-CM

## 2019-06-25 DIAGNOSIS — I10 Essential (primary) hypertension: Secondary | ICD-10-CM

## 2019-06-25 DIAGNOSIS — Z7689 Persons encountering health services in other specified circumstances: Secondary | ICD-10-CM

## 2019-06-25 MED ORDER — LISINOPRIL 5 MG PO TABS: 5.0000 mg | ORAL_TABLET | Freq: Every day | ORAL | 3 refills | Status: AC

## 2019-06-25 NOTE — Progress Notes (Signed)
Veronica Parks is a 38 y.o. female  Chief Complaint  Patient presents with  . New Patient (Initial Visit)    feels like she can noticed the blood rushing in her ear  . Hypertension    noticed it has been running as high as in the 150s over the 100s    HPI: Veronica Parks is a 38 y.o. female here as a new patient to establish care with our office. She has 1 son (9yo) and works as a Engineer, maintenance (IT). Pt is concerned about BP elevation with readings at recent appts in 130-150's/90-100's. Pt denies headache, dizziness, vision changes, CP, SOB, LE edema. She does states she can feel/hear blood "rushing" in her ears Rt >Lt. No hearing change, no ear pain.  Pt has gained weight - 40+ lbs after losing 40lbs. She was scheduled for bariatric sx but changed insurance and this is no longer covered. She would like referral to weight loss/management clinic. + fam h/o HTN - mother  Past Medical History:  Diagnosis Date  . Plantar fasciitis     Past Surgical History:  Procedure Laterality Date  . CESAREAN SECTION      Social History   Socioeconomic History  . Marital status: Divorced    Spouse name: Not on file  . Number of children: Not on file  . Years of education: Not on file  . Highest education level: Not on file  Occupational History  . Not on file  Social Needs  . Financial resource strain: Not on file  . Food insecurity    Worry: Not on file    Inability: Not on file  . Transportation needs    Medical: Not on file    Non-medical: Not on file  Tobacco Use  . Smoking status: Never Smoker  . Smokeless tobacco: Never Used  Substance and Sexual Activity  . Alcohol use: Yes    Alcohol/week: 0.0 standard drinks  . Drug use: No  . Sexual activity: Not on file  Lifestyle  . Physical activity    Days per week: Not on file    Minutes per session: Not on file  . Stress: Not on file  Relationships  . Social Herbalist on phone: Not on file    Gets together: Not on file   Attends religious service: Not on file    Active member of club or organization: Not on file    Attends meetings of clubs or organizations: Not on file    Relationship status: Not on file  . Intimate partner violence    Fear of current or ex partner: Not on file    Emotionally abused: Not on file    Physically abused: Not on file    Forced sexual activity: Not on file  Other Topics Concern  . Not on file  Social History Narrative  . Not on file    Family History  Problem Relation Age of Onset  . Hypertension Mother   . Cancer Maternal Grandfather      Immunization History  Administered Date(s) Administered  . DTaP 12/07/1980, 01/04/1981, 01/29/1982, 05/18/1982  . IPV 12/07/1980, 01/04/1981, 01/29/1981, 05/14/1983  . Influenza-Unspecified 04/20/2015  . MMR 01/11/1982, 01/27/1999  . Td 12/18/1992  . Tdap 08/28/2011    Outpatient Encounter Medications as of 06/25/2019  Medication Sig  . diclofenac (VOLTAREN) 75 MG EC tablet Take 1 tablet (75 mg total) by mouth 2 (two) times daily.   No facility-administered encounter medications on file as of  06/25/2019.      ROS: Pertinent positives and negatives noted in HPI. Remainder of ROS non-contributory   Allergies  Allergen Reactions  . Nickel Itching and Rash    BP (!) 134/108   Pulse 79   Temp (!) 97.4 F (36.3 C)   Ht 5' 5"  (1.651 m)   Wt 265 lb 6.4 oz (120.4 kg)   SpO2 98%   BMI 44.16 kg/m    BP Readings from Last 3 Encounters:  06/25/19 (!) 134/108  06/09/19 139/88  03/15/16 140/90   Pulse Readings from Last 3 Encounters:  06/25/19 79  06/09/19 89  03/15/16 87   Wt Readings from Last 3 Encounters:  06/25/19 265 lb 6.4 oz (120.4 kg)  03/15/16 250 lb (113.4 kg)  08/03/14 205 lb (93 kg)     Physical Exam  Constitutional: She is oriented to person, place, and time. She appears well-developed and well-nourished. No distress.  Neck: Neck supple. No thyromegaly present.  Cardiovascular: Normal rate,  regular rhythm, normal heart sounds and intact distal pulses.  Pulmonary/Chest: Effort normal and breath sounds normal. No respiratory distress.  Musculoskeletal:        General: No edema.  Lymphadenopathy:    She has no cervical adenopathy.  Neurological: She is alert and oriented to person, place, and time. Coordination normal.  Psychiatric: She has a normal mood and affect. Her behavior is normal.     A/P:  1. Encounter to establish care with new doctor  2. BMI 40.0-44.9, adult ([facility]) 3. Morbid obesity ([facility]) - Amb Ref to Medical Weight Management  4. Essential hypertension - DASH diet Rx: - lisinopril (ZESTRIL) 5 MG tablet; Take 1 tablet (5 mg total) by mouth daily.  Dispense: 90 tablet; Refill: 3 - pt will check BP at home using her mother's BP cuff and send message to me with these readings via MyChart Discussed plan and reviewed medications with patient, including risks, benefits, and potential side effects. Pt expressed understand. All questions answered.   This visit occurred during the SARS-CoV-2 public health emergency.  Safety protocols were in place, including screening questions prior to the visit, additional usage of staff PPE, and extensive cleaning of exam room while observing appropriate contact time as indicated for disinfecting solutions.

## 2019-06-25 NOTE — Patient Instructions (Addendum)
[address] [place], [place], [place] [address] [phone]  [email or web address]   DASH Eating Plan DASH stands for "Dietary Approaches to Stop Hypertension." The DASH eating plan is a healthy eating plan that has been shown to reduce high blood pressure (hypertension). It may also reduce your risk for type 2 diabetes, heart disease, and stroke. The DASH eating plan may also help with weight loss. What are tips for following this plan?  General guidelines  Avoid eating more than 2,300 mg (milligrams) of salt (sodium) a day. If you have hypertension, you may need to reduce your sodium intake to 1,500 mg a day.  Limit alcohol intake to no more than 1 drink a day for nonpregnant women and 2 drinks a day for men. One drink equals 12 oz of beer, 5 oz of wine, or 1 oz of hard liquor.  Work with your health care provider to maintain a healthy body weight or to lose weight. Ask what an ideal weight is for you.  Get at least 30 minutes of exercise that causes your heart to beat faster (aerobic exercise) most days of the week. Activities may include walking, swimming, or biking.  Work with your health care provider or diet and nutrition specialist (dietitian) to adjust your eating plan to your individual calorie needs. Reading food labels   Check food labels for the amount of sodium per serving. Choose foods with less than 5 percent of the Daily Value of sodium. Generally, foods with less than 300 mg of sodium per serving fit into this eating plan.  To find whole grains, look for the word "whole" as the first word in the ingredient list. Shopping  Buy products labeled as "low-sodium" or "no salt added."  Buy fresh foods. Avoid canned foods and premade or frozen meals. Cooking  Avoid adding salt when cooking. Use salt-free seasonings or herbs instead of table salt or sea salt. Check with your health care provider or pharmacist before using  salt substitutes.  Do not fry foods. Cook foods using healthy methods such as baking, boiling, grilling, and broiling instead.  Cook with heart-healthy oils, such as olive, canola, soybean, or sunflower oil. Meal planning  Eat a balanced diet that includes: ? 5 or more servings of fruits and vegetables each day. At each meal, try to fill half of your plate with fruits and vegetables. ? Up to 6-8 servings of whole grains each day. ? Less than 6 oz of lean meat, poultry, or fish each day. A 3-oz serving of meat is about the same size as a deck of cards. One egg equals 1 oz. ? 2 servings of low-fat dairy each day. ? A serving of nuts, seeds, or beans 5 times each week. ? Heart-healthy fats. Healthy fats called Omega-3 fatty acids are found in foods such as flaxseeds and coldwater fish, like sardines, salmon, and mackerel.  Limit how much you eat of the following: ? Canned or prepackaged foods. ? Food that is high in trans fat, such as fried foods. ? Food that is high in saturated fat, such as fatty meat. ? Sweets, desserts, sugary drinks, and other foods with added sugar. ? Full-fat dairy products.  Do not salt foods before eating.  Try to eat at least 2 vegetarian meals each week.  Eat more home-cooked food and less restaurant, buffet, and fast food.  When eating at a restaurant, ask that your food be prepared with less salt or no salt, if possible. What foods are recommended? The  items listed may not be a complete list. Talk with your dietitian about what dietary choices are best for you. Grains Whole-grain or whole-wheat bread. Whole-grain or whole-wheat pasta. Brown rice. [place]. Bulgur. Whole-grain and low-sodium cereals. Pita bread. Low-fat, low-sodium crackers. Whole-wheat flour tortillas. Vegetables Fresh or frozen vegetables (raw, steamed, roasted, or grilled). Low-sodium or reduced-sodium tomato and vegetable juice. Low-sodium or reduced-sodium tomato sauce and  tomato paste. Low-sodium or reduced-sodium canned vegetables. Fruits All fresh, dried, or frozen fruit. Canned fruit in natural juice (without added sugar). Meat and other protein foods Skinless chicken or [place]. Ground chicken or [place]. Pork with fat trimmed off. Fish and seafood. Egg whites. Dried beans, peas, or lentils. Unsalted nuts, nut butters, and seeds. Unsalted canned beans. Lean cuts of beef with fat trimmed off. Low-sodium, lean deli meat. Dairy Low-fat (1%) or fat-free (skim) milk. Fat-free, low-fat, or reduced-fat cheeses. Nonfat, low-sodium ricotta or cottage cheese. Low-fat or nonfat yogurt. Low-fat, low-sodium cheese. Fats and oils Soft margarine without trans fats. Vegetable oil. Low-fat, reduced-fat, or light mayonnaise and salad dressings (reduced-sodium). Canola, safflower, olive, soybean, and sunflower oils. Avocado. Seasoning and other foods Herbs. Spices. Seasoning mixes without salt. Unsalted popcorn and pretzels. Fat-free sweets. What foods are not recommended? The items listed may not be a complete list. Talk with your dietitian about what dietary choices are best for you. Grains Baked goods made with fat, such as croissants, muffins, or some breads. Dry pasta or rice meal packs. Vegetables Creamed or fried vegetables. Vegetables in a cheese sauce. Regular canned vegetables (not low-sodium or reduced-sodium). Regular canned tomato sauce and paste (not low-sodium or reduced-sodium). Regular tomato and vegetable juice (not low-sodium or reduced-sodium). Angie Fava. Olives. Fruits Canned fruit in a light or heavy syrup. Fried fruit. Fruit in cream or butter sauce. Meat and other protein foods Fatty cuts of meat. Ribs. Fried meat. Berniece Salines. Sausage. Bologna and other processed lunch meats. Salami. Fatback. Hotdogs. Bratwurst. Salted nuts and seeds. Canned beans with added salt. Canned or smoked fish. Whole eggs or egg yolks. Chicken or [place] with skin. Dairy Whole or 2%  milk, cream, and half-and-half. Whole or full-fat cream cheese. Whole-fat or sweetened yogurt. Full-fat cheese. Nondairy creamers. Whipped toppings. Processed cheese and cheese spreads. Fats and oils Butter. Stick margarine. Lard. Shortening. Ghee. Bacon fat. Tropical oils, such as coconut, palm kernel, or palm oil. Seasoning and other foods Salted popcorn and pretzels. Onion salt, garlic salt, seasoned salt, table salt, and sea salt. Worcestershire sauce. Tartar sauce. Barbecue sauce. Teriyaki sauce. Soy sauce, including reduced-sodium. Steak sauce. Canned and packaged gravies. Fish sauce. Oyster sauce. Cocktail sauce. Horseradish that you find on the shelf. Ketchup. Mustard. Meat flavorings and tenderizers. Bouillon cubes. Hot sauce and Tabasco sauce. Premade or packaged marinades. Premade or packaged taco seasonings. Relishes. Regular salad dressings. Where to find more information:  National Heart, Lung, and [place]: https://wilson-eaton.com/  American Heart Association: www.heart.org Summary  The DASH eating plan is a healthy eating plan that has been shown to reduce high blood pressure (hypertension). It may also reduce your risk for type 2 diabetes, heart disease, and stroke.  With the DASH eating plan, you should limit salt (sodium) intake to 2,300 mg a day. If you have hypertension, you may need to reduce your sodium intake to 1,500 mg a day.  When on the DASH eating plan, aim to eat more fresh fruits and vegetables, whole grains, lean proteins, low-fat dairy, and heart-healthy fats.  Work with your health care provider  or diet and nutrition specialist (dietitian) to adjust your eating plan to your individual calorie needs. This information is not intended to replace advice given to you by your health care provider. Make sure you discuss any questions you have with your health care provider. Document Released: 06/22/2011 Document Revised: 06/15/2017 Document Reviewed:  06/26/2016 Elsevier Patient Education  2020 [employer]   Hypertension, Adult Hypertension is another name for high blood pressure. High blood pressure forces your heart to work harder to pump blood. This can cause problems over time. There are two numbers in a blood pressure reading. There is a top number (systolic) over a bottom number (diastolic). It is best to have a blood pressure that is below 120/80. Healthy choices can help lower your blood pressure, or you may need medicine to help lower it. What are the causes? The cause of this condition is not known. Some conditions may be related to high blood pressure. What increases the risk?  Smoking.  Having type 2 diabetes mellitus, high cholesterol, or both.  Not getting enough exercise or physical activity.  Being overweight.  Having too much fat, sugar, calories, or salt (sodium) in your diet.  Drinking too much alcohol.  Having long-term (chronic) kidney disease.  Having a family history of high blood pressure.  Age. Risk increases with age.  Race. You may be at higher risk if you are African American.  Gender. Men are at higher risk than women before age 38. After age 38, women are at higher risk than men.  Having obstructive sleep apnea.  Stress. What are the signs or symptoms?  High blood pressure may not cause symptoms. Very high blood pressure (hypertensive crisis) may cause: ? Headache. ? Feelings of worry or nervousness (anxiety). ? Shortness of breath. ? Nosebleed. ? A feeling of being sick to your stomach (nausea). ? Throwing up (vomiting). ? Changes in how you see. ? Very bad chest pain. ? Seizures. How is this treated?  This condition is treated by making healthy lifestyle changes, such as: ? Eating healthy foods. ? Exercising more. ? Drinking less alcohol.  Your health care provider may prescribe medicine if lifestyle changes are not enough to get your blood pressure under control, and  if: ? Your top number is above 130. ? Your bottom number is above 80.  Your personal target blood pressure may vary. Follow these instructions at home: Eating and drinking   If told, follow the DASH eating plan. To follow this plan: ? Fill one half of your plate at each meal with fruits and vegetables. ? Fill one fourth of your plate at each meal with whole grains. Whole grains include whole-wheat pasta, brown rice, and whole-grain bread. ? Eat or drink low-fat dairy products, such as skim milk or low-fat yogurt. ? Fill one fourth of your plate at each meal with low-fat (lean) proteins. Low-fat proteins include fish, chicken without skin, eggs, beans, and tofu. ? Avoid fatty meat, cured and processed meat, or chicken with skin. ? Avoid pre-made or processed food.  Eat less than 1,500 mg of salt each day.  Do not drink alcohol if: ? Your doctor tells you not to drink. ? You are pregnant, may be pregnant, or are planning to become pregnant.  If you drink alcohol: ? Limit how much you use to:  0-1 drink a day for women.  0-2 drinks a day for men. ? Be aware of how much alcohol is in your drink. In the [place] one  drink equals one 12 oz bottle of beer (355 mL), one 5 oz glass of wine (148 mL), or one 1 oz glass of hard liquor (44 mL). Lifestyle   Work with your doctor to stay at a healthy weight or to lose weight. Ask your doctor what the best weight is for you.  Get at least 30 minutes of exercise most days of the week. This may include walking, swimming, or biking.  Get at least 30 minutes of exercise that strengthens your muscles (resistance exercise) at least 3 days a week. This may include lifting weights or doing Pilates.  Do not use any products that contain nicotine or tobacco, such as cigarettes, e-cigarettes, and chewing tobacco. If you need help quitting, ask your doctor.  Check your blood pressure at home as told by your doctor.  Keep all follow-up visits as told by  your doctor. This is important. Medicines  Take over-the-counter and prescription medicines only as told by your doctor. Follow directions carefully.  Do not skip doses of blood pressure medicine. The medicine does not work as well if you skip doses. Skipping doses also puts you at risk for problems.  Ask your doctor about side effects or reactions to medicines that you should watch for. Contact a doctor if you:  Think you are having a reaction to the medicine you are taking.  Have headaches that keep coming back (recurring).  Feel dizzy.  Have swelling in your ankles.  Have trouble with your vision. Get help right away if you:  Get a very bad headache.  Start to feel mixed up (confused).  Feel weak or numb.  Feel faint.  Have very bad pain in your: ? Chest. ? Belly (abdomen).  Throw up more than once.  Have trouble breathing. Summary  Hypertension is another name for high blood pressure.  High blood pressure forces your heart to work harder to pump blood.  For most people, a normal blood pressure is less than 120/80.  Making healthy choices can help lower blood pressure. If your blood pressure does not get lower with healthy choices, you may need to take medicine. This information is not intended to replace advice given to you by your health care provider. Make sure you discuss any questions you have with your health care provider. Document Released: 12/20/2007 Document Revised: 03/13/2018 Document Reviewed: 03/13/2018 Elsevier Patient Education  2020 [employer].

## 2019-07-16 ENCOUNTER — Ambulatory Visit: Payer: 59 | Admitting: Podiatry

## 2019-08-14 ENCOUNTER — Ambulatory Visit: Payer: 59 | Admitting: Psychology

## 2019-09-04 ENCOUNTER — Ambulatory Visit: Payer: 59 | Admitting: Psychology

## 2020-10-20 ENCOUNTER — Other Ambulatory Visit: Payer: Self-pay | Admitting: Obstetrics and Gynecology

## 2020-10-20 DIAGNOSIS — R928 Other abnormal and inconclusive findings on diagnostic imaging of breast: Secondary | ICD-10-CM

## 2020-10-22 ENCOUNTER — Other Ambulatory Visit: Payer: Self-pay

## 2020-10-22 ENCOUNTER — Ambulatory Visit
Admission: RE | Admit: 2020-10-22 | Discharge: 2020-10-22 | Disposition: A | Discharge status: Home / Self Care | Payer: 59 | Source: Ambulatory Visit | Attending: Obstetrics and Gynecology | Admitting: Obstetrics and Gynecology

## 2020-10-22 ENCOUNTER — Other Ambulatory Visit: Payer: Self-pay | Admitting: Obstetrics and Gynecology

## 2020-10-22 DIAGNOSIS — R921 Mammographic calcification found on diagnostic imaging of breast: Secondary | ICD-10-CM

## 2020-10-22 DIAGNOSIS — R928 Other abnormal and inconclusive findings on diagnostic imaging of breast: Secondary | ICD-10-CM

## 2021-05-16 ENCOUNTER — Ambulatory Visit
Admission: RE | Admit: 2021-05-16 | Discharge: 2021-05-16 | Disposition: A | Discharge status: Home / Self Care | Payer: 59 | Source: Ambulatory Visit | Attending: Obstetrics and Gynecology | Admitting: Obstetrics and Gynecology

## 2021-05-16 ENCOUNTER — Other Ambulatory Visit: Payer: Self-pay | Admitting: Obstetrics and Gynecology

## 2021-05-16 ENCOUNTER — Other Ambulatory Visit: Payer: Self-pay

## 2021-05-16 DIAGNOSIS — R921 Mammographic calcification found on diagnostic imaging of breast: Secondary | ICD-10-CM

## 2021-12-06 ENCOUNTER — Ambulatory Visit
Admission: RE | Admit: 2021-12-06 | Discharge: 2021-12-06 | Disposition: A | Discharge status: Home / Self Care | Payer: 59 | Source: Ambulatory Visit | Attending: Obstetrics and Gynecology | Admitting: Obstetrics and Gynecology

## 2021-12-06 DIAGNOSIS — R921 Mammographic calcification found on diagnostic imaging of breast: Secondary | ICD-10-CM

## 2022-10-27 ENCOUNTER — Other Ambulatory Visit: Payer: Self-pay | Admitting: Obstetrics and Gynecology

## 2022-10-27 DIAGNOSIS — R921 Mammographic calcification found on diagnostic imaging of breast: Secondary | ICD-10-CM

## 2022-12-14 ENCOUNTER — Ambulatory Visit
Admission: RE | Admit: 2022-12-14 | Discharge: 2022-12-14 | Disposition: A | Discharge status: Home / Self Care | Payer: 59 | Source: Ambulatory Visit | Attending: Obstetrics and Gynecology | Admitting: Obstetrics and Gynecology

## 2022-12-14 DIAGNOSIS — R921 Mammographic calcification found on diagnostic imaging of breast: Secondary | ICD-10-CM

## 2022-12-16 IMAGING — MG DIGITAL DIAGNOSTIC BILAT W/ TOMO W/ CAD
8 of 12 series · 8 of 32 positions shown · non-contrast
Comparison: Previous exam(s).

CLINICAL DATA: Short-term follow-up for likely benign left breast
calcifications.

EXAM:
DIGITAL DIAGNOSTIC BILATERAL MAMMOGRAM WITH TOMOSYNTHESIS AND CAD
TECHNIQUE: Bilateral digital diagnostic mammography and breast tomosynthesis
was performed. The images were evaluated with computer-aided
detection.

[L ML]
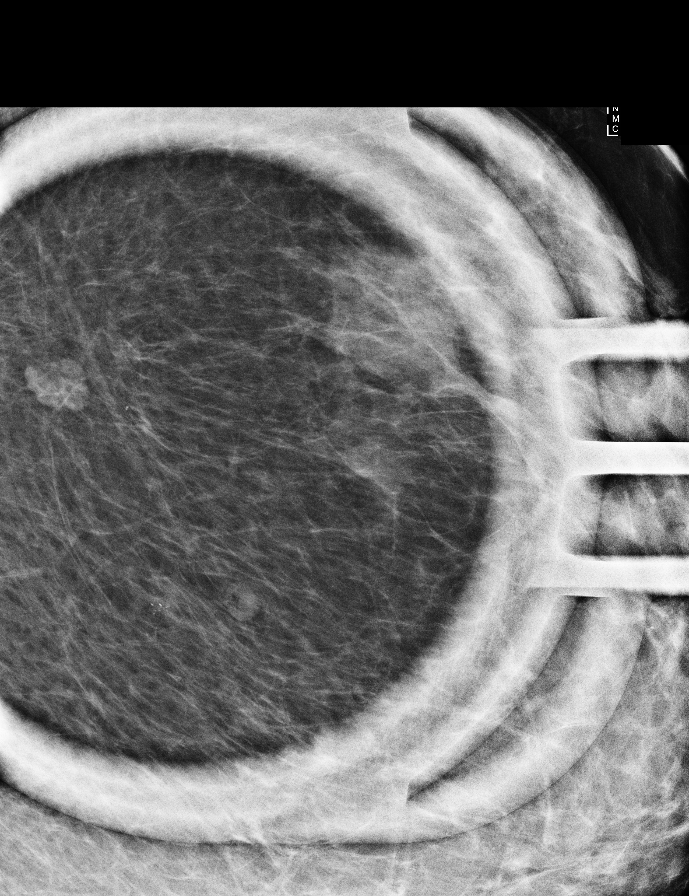

[L CC]
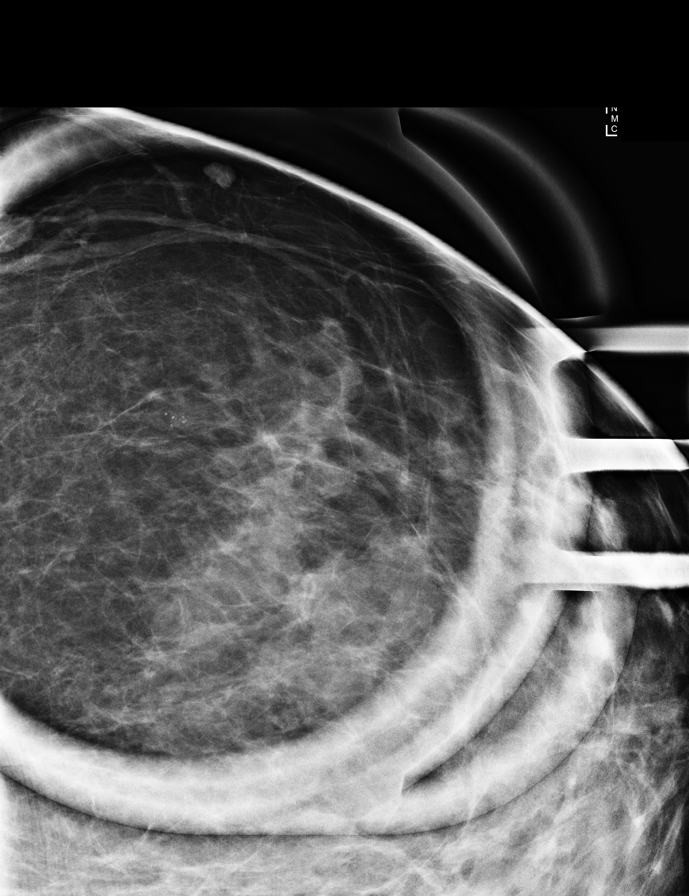

[R MLO synth-2D]
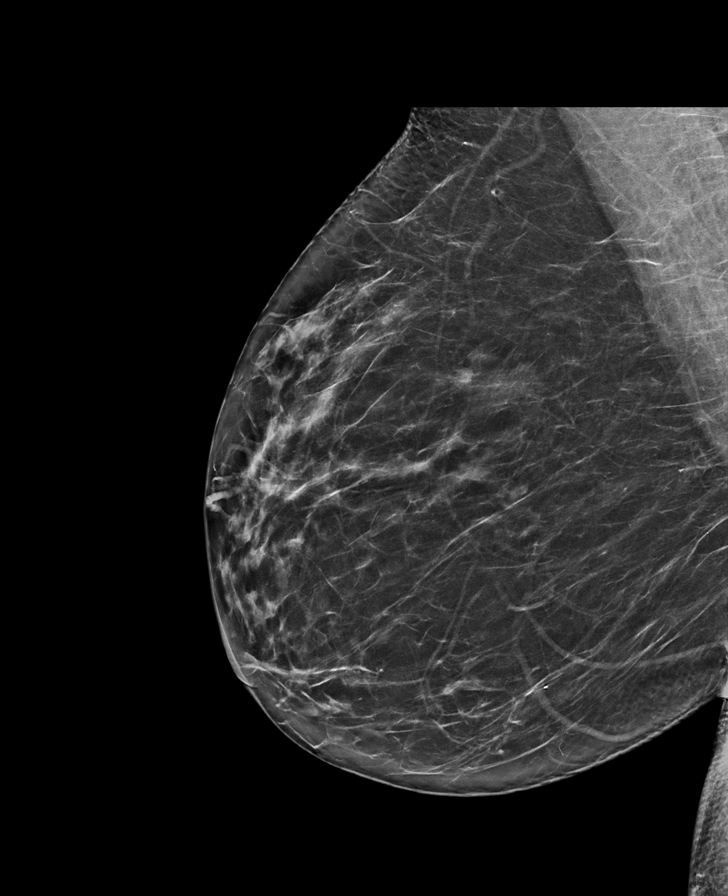

[R CC synth-2D]
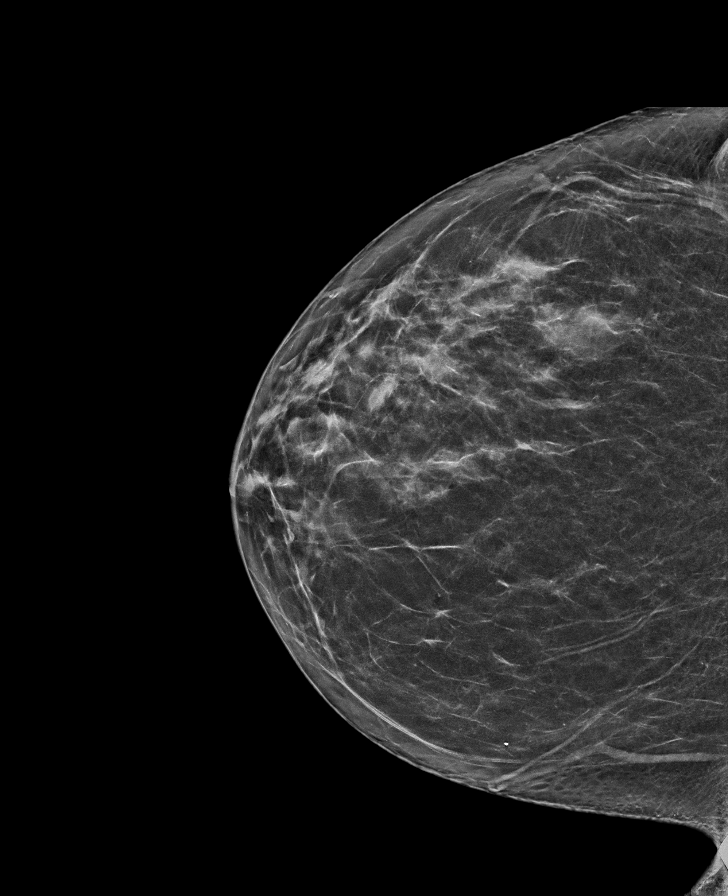

[L ML synth-2D]
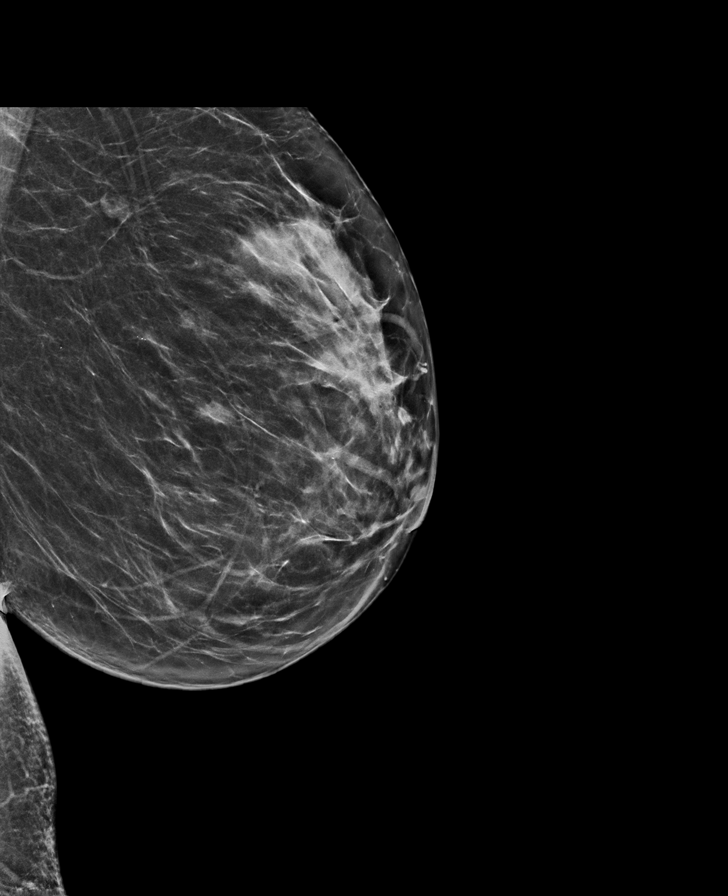

[L CC synth-2D]
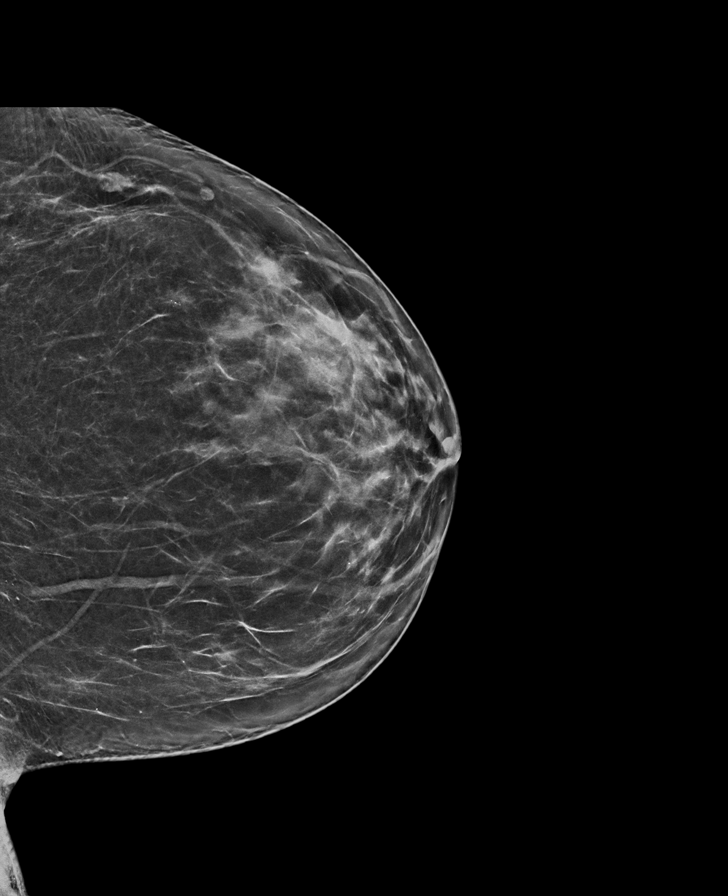

[L MLO synth-2D]
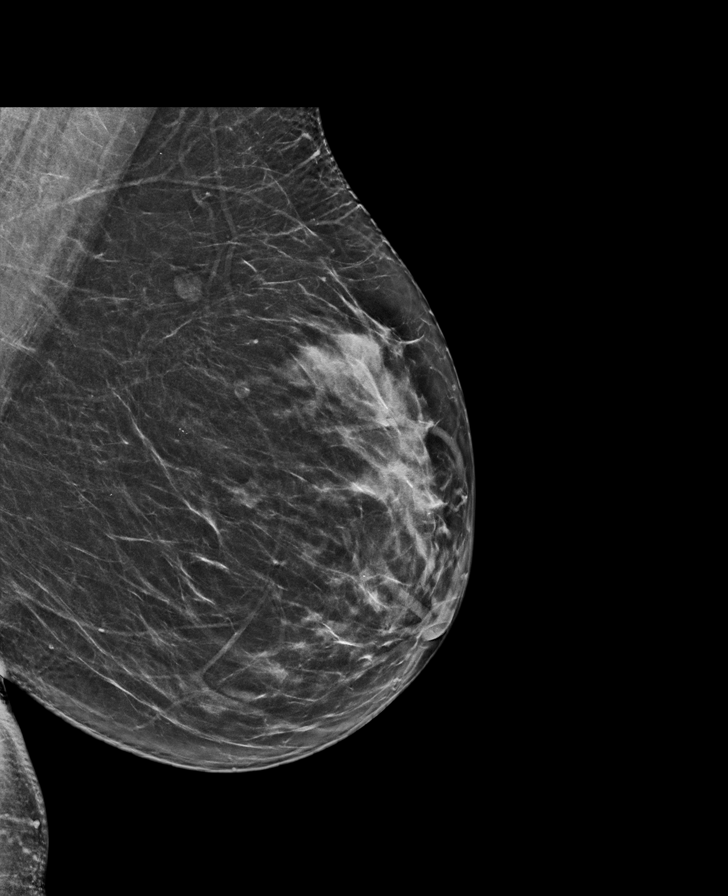

[L MLO tomo · tomo slice 37/73.0]
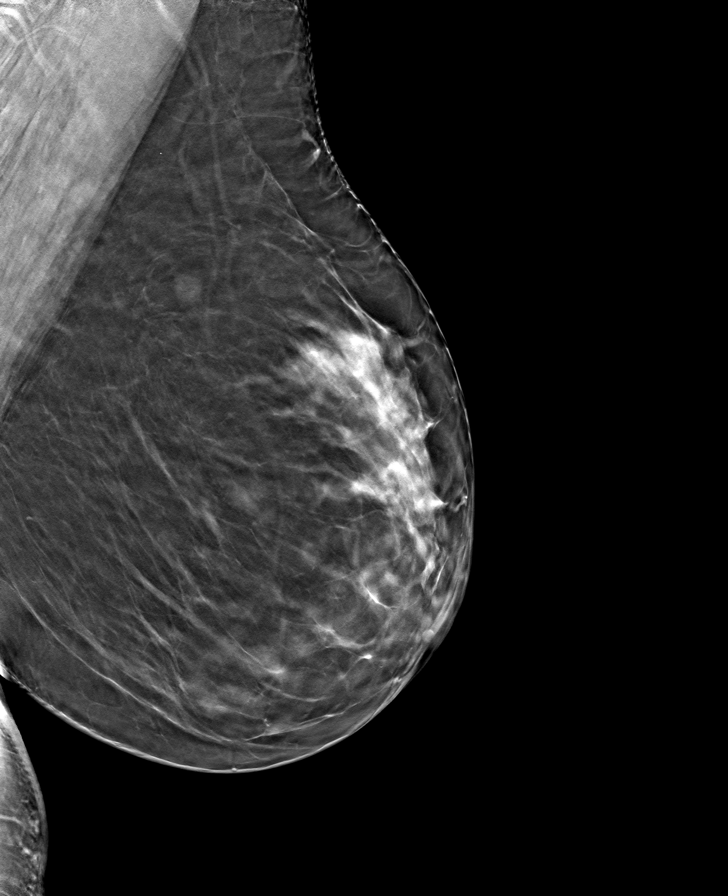

[8 of 32 positions shown; findings below may reference images not displayed]

ACR Breast Density Category c: The breast tissue is heterogeneously
dense, which may obscure small masses.
FINDINGS: The 3 mm group of calcifications in the lateral aspect of the left
breast are mammographically stable. No suspicious calcifications,
masses or areas of distortion are seen in the bilateral breasts.
IMPRESSION: 1.  Stable likely benign left breast calcifications.

2.  No mammographic evidence of malignancy in the bilateral breasts.

RECOMMENDATION:
Diagnostic mammogram is suggested in 1 year. (Code:[id number])

I have discussed the findings and recommendations with the patient.
If applicable, a reminder letter will be sent to the patient
regarding the next appointment.

BI-RADS CATEGORY  3: Probably benign.

## 2023-01-24 ENCOUNTER — Ambulatory Visit (INDEPENDENT_AMBULATORY_CARE_PROVIDER_SITE_OTHER): Payer: 59 | Admitting: Podiatry

## 2023-01-24 ENCOUNTER — Ambulatory Visit (INDEPENDENT_AMBULATORY_CARE_PROVIDER_SITE_OTHER): Payer: 59

## 2023-01-24 ENCOUNTER — Encounter: Payer: Self-pay | Admitting: Podiatry

## 2023-01-24 DIAGNOSIS — M722 Plantar fascial fibromatosis: Secondary | ICD-10-CM

## 2023-01-24 DIAGNOSIS — M79671 Pain in right foot: Secondary | ICD-10-CM

## 2023-01-24 DIAGNOSIS — M79672 Pain in left foot: Secondary | ICD-10-CM | POA: Diagnosis not present

## 2023-01-24 MED ORDER — DICLOFENAC SODIUM 75 MG PO TBEC: 75.0000 mg | DELAYED_RELEASE_TABLET | Freq: Two times a day (BID) | ORAL | 2 refills | Status: AC

## 2023-01-24 MED ORDER — TRIAMCINOLONE ACETONIDE 10 MG/ML IJ SUSP
10.0000 mg | Freq: Once | INTRAMUSCULAR | Status: AC
  Administered 2023-01-24: 10 mg

## 2023-01-24 NOTE — Patient Instructions (Signed)

## 2023-01-25 NOTE — Progress Notes (Signed)
Subjective:   Patient ID: Veronica Parks, female   DOB: 42 y.o.   MRN: [id number]   HPI Patient presents with a lot of pain plantar aspect right heel at the insertional point of the tendon into the calcaneus with fluid buildup.  Patient does not currently smoke is moderately obese likes to be active   Review of Systems  All other systems reviewed and are negative.       Objective:  Physical Exam Vitals and nursing note reviewed.  Constitutional:      Appearance: She is well-developed.  Pulmonary:     Effort: Pulmonary effort is normal.  Musculoskeletal:        General: Normal range of motion.  Skin:    General: Skin is warm.  Neurological:     Mental Status: She is alert.     Neurovascular status intact muscle strength adequate range of motion adequate exquisite discomfort noted plantar heel right at the insertional point of the tendon into the calcaneus with inflammation fluid along the medial band     Assessment:  Acute Planter fasciitis right inflammation fluid of the medial band     Plan:  H&P reviewed condition and at this point sterile prep injected the medial band of the fascia 3 mg Kenalog 5 mg Xylocaine applied fascial brace and instructed on stretching exercises ice therapy and reappoint to recheck  X-rays indicate small spur no indication stress fracture arthritis

## 2023-02-28 ENCOUNTER — Ambulatory Visit: Payer: 59 | Admitting: Podiatry

## 2023-03-07 ENCOUNTER — Ambulatory Visit: Payer: 59 | Admitting: Podiatry

## 2023-03-22 ENCOUNTER — Ambulatory Visit (INDEPENDENT_AMBULATORY_CARE_PROVIDER_SITE_OTHER): Payer: 59 | Admitting: Podiatry

## 2023-03-22 ENCOUNTER — Encounter: Payer: Self-pay | Admitting: Podiatry

## 2023-03-22 DIAGNOSIS — M722 Plantar fascial fibromatosis: Secondary | ICD-10-CM | POA: Diagnosis not present

## 2023-03-22 MED ORDER — TRIAMCINOLONE ACETONIDE 10 MG/ML IJ SUSP
10.0000 mg | Freq: Once | INTRAMUSCULAR | Status: AC
  Administered 2023-03-22: 10 mg via INTRA_ARTICULAR

## 2023-03-22 NOTE — Progress Notes (Signed)
Subjective:   Patient ID: Veronica Parks, female   DOB: 42 y.o.   MRN: [id number]   HPI Patient presents with a lot of discomfort still in the right plantar fascia but more distal within the fascia itself and into the mid arch area.   ROS      Objective:  Physical Exam  Acute mid arch plantar fasciitis right fluid buildup noted with patient having inability to stretch the foot properly and hurts most after periods of sitting     Assessment:  Acute plantar fasciitis right inflammation fluid of the center band and mid arch      Plan:  H&P reviewed I went ahead today did a mid arch injection 3 mg Kenalog 5 mg Xylocaine and then dispensed night splint to properly stretch the foot and take all pressure off.  Explained this and how to use them properly and fitted properly to the lower leg

## 2023-12-31 ENCOUNTER — Encounter: Payer: Self-pay | Admitting: Podiatry

## 2023-12-31 ENCOUNTER — Ambulatory Visit (INDEPENDENT_AMBULATORY_CARE_PROVIDER_SITE_OTHER): Admitting: Podiatry

## 2023-12-31 VITALS — Ht 65.0 in | Wt 265.4 lb

## 2023-12-31 DIAGNOSIS — M722 Plantar fascial fibromatosis: Secondary | ICD-10-CM

## 2023-12-31 MED ORDER — TRIAMCINOLONE ACETONIDE 10 MG/ML IJ SUSP: 10.0000 mg | Freq: Once | INTRAMUSCULAR | Status: AC

## 2023-12-31 MED ORDER — MELOXICAM 15 MG PO TABS: 15.0000 mg | ORAL_TABLET | Freq: Every day | ORAL | 2 refills | Status: AC

## 2024-01-02 NOTE — Progress Notes (Signed)
 Subjective:   Patient ID: Veronica Parks, female   DOB: 43 y.o.   MRN: [id number]   HPI Patient states that she has had a reoccurrence in the last couple weeks of pain in her left heel and she did excellent for around 8 months   ROS      Objective:  Physical Exam  Neurovascular status intact exquisite discomfort medial fascial band left at the insertional point tendon calcaneus with fluid buildup     Assessment:  Acute plantar fasciitis left with inflammation fluid noted     Plan:  H&P reviewed sterile prep and injected the plantar fascia at insertion 3 mg Kenalog  5 mg Xylocaine and applied and instructed on stretching exercises shoe gear modification

## 2024-01-28 ENCOUNTER — Ambulatory Visit: Admitting: Podiatry

## 2024-03-02 ENCOUNTER — Other Ambulatory Visit (HOSPITAL_BASED_OUTPATIENT_CLINIC_OR_DEPARTMENT_OTHER): Payer: Self-pay
